# Patient Record
Sex: Male | Born: 1955 | Race: White | Hispanic: No | Marital: Married | Smoking: Never smoker
Health system: Southern US, Community
[De-identification: ages and names within clinical notes are randomized; demographics above are authoritative.]

---

## 1999-11-10 ENCOUNTER — Ambulatory Visit (HOSPITAL_COMMUNITY): Admission: RE | Admit: 1999-11-10 | Discharge: 1999-11-10 | Payer: Self-pay | Admitting: Family Medicine

## 1999-11-10 ENCOUNTER — Encounter: Payer: Self-pay | Admitting: Family Medicine

## 2000-01-01 ENCOUNTER — Ambulatory Visit (HOSPITAL_BASED_OUTPATIENT_CLINIC_OR_DEPARTMENT_OTHER): Admission: RE | Admit: 2000-01-01 | Discharge: 2000-01-01 | Payer: Self-pay | Admitting: General Surgery

## 2000-01-01 ENCOUNTER — Encounter (INDEPENDENT_AMBULATORY_CARE_PROVIDER_SITE_OTHER): Payer: Self-pay | Admitting: *Deleted

## 2001-07-07 ENCOUNTER — Ambulatory Visit (HOSPITAL_COMMUNITY): Admission: RE | Admit: 2001-07-07 | Discharge: 2001-07-07 | Payer: Self-pay | Admitting: Family Medicine

## 2001-07-07 ENCOUNTER — Encounter: Payer: Self-pay | Admitting: Family Medicine

## 2001-10-23 ENCOUNTER — Ambulatory Visit (HOSPITAL_BASED_OUTPATIENT_CLINIC_OR_DEPARTMENT_OTHER): Admission: RE | Admit: 2001-10-23 | Discharge: 2001-10-23 | Payer: Self-pay | Admitting: Family Medicine

## 2002-04-20 ENCOUNTER — Ambulatory Visit (HOSPITAL_COMMUNITY): Admission: RE | Admit: 2002-04-20 | Discharge: 2002-04-20 | Payer: Self-pay | Admitting: Family Medicine

## 2002-04-20 ENCOUNTER — Encounter: Payer: Self-pay | Admitting: Family Medicine

## 2002-04-24 ENCOUNTER — Ambulatory Visit (HOSPITAL_COMMUNITY): Admission: RE | Admit: 2002-04-24 | Discharge: 2002-04-24 | Payer: Self-pay | Admitting: Family Medicine

## 2002-04-24 ENCOUNTER — Encounter: Payer: Self-pay | Admitting: Family Medicine

## 2005-04-04 ENCOUNTER — Emergency Department (HOSPITAL_COMMUNITY): Admission: EM | Admit: 2005-04-04 | Discharge: 2005-04-04 | Payer: Self-pay | Admitting: *Deleted

## 2006-05-10 ENCOUNTER — Encounter: Admission: RE | Admit: 2006-05-10 | Discharge: 2006-08-08 | Payer: Self-pay | Admitting: *Deleted

## 2008-02-23 ENCOUNTER — Encounter: Admission: RE | Admit: 2008-02-23 | Discharge: 2008-02-23 | Payer: Self-pay | Admitting: Orthopedic Surgery

## 2009-11-08 ENCOUNTER — Encounter: Admission: RE | Admit: 2009-11-08 | Discharge: 2009-11-08 | Payer: Self-pay | Admitting: Family Medicine

## 2009-11-12 ENCOUNTER — Encounter: Admission: RE | Admit: 2009-11-12 | Discharge: 2009-11-12 | Payer: Self-pay | Admitting: Family Medicine

## 2010-06-13 NOTE — Op Note (Signed)
[place]  Patient:    Matthew Morgan, Matthew Morgan                      MRN: [id number] Proc. Date: 01/01/00 Adm. Date:  [id number] Attending:  Arlis Porta CC:         Meredith Staggers, M.D.   Operative Report  PREOPERATIVE DIAGNOSIS:  Umbilical hernia.  POSTOPERATIVE DIAGNOSIS:  Incarcerated umbilical hernia.  OPERATION PERFORMED:  Repair of incarcerated umbilical hernia with mesh.  SURGEON:  Adolph Pollack, M.D.  ANESTHESIA:  General plus 0.5% Marcaine with epinephrine for local anesthetic effect.  INDICATIONS FOR PROCEDURE:  The patient is a 55 year old male who has been having an increasingly symptomatic umbilical hernia.  He now presents for repair.  DESCRIPTION OF PROCEDURE:  He was placed supine on the operating table and a general anesthetic was administered. The periumbilical area was shaved and sterilely prepped and draped.  Local anesthetic was infiltrated in a periumbilical fashion and a curvilinear subumbilical incision was made incising the skin and subcutaneous tissues sharply.  The hernia sac was identified.  The subcutaneous tissues were dissected down to the level of the fascia.  The umbilicus was amputated and the hernia sac opened, the sac excised, and the omental contents reduced back into the abdominal cavity.  Next, the fatty tissue was separated from the anterior rectus fascia in the circumferential fashion. The hernia defect was then closed with interrupted 0 Surgilon sutures.  A piece of onlay polypropylene mesh was placed over the closure and anchored to the fascia with interrupted Surgilon sutures.  The umbilicus was then reattached to the mesh with interrupted 3-0 Vicryl suture.  The area was irrigated and the subcutaneous tissue was closed over the mesh with running 3-0 Vicryl suture.  The skin was closed with a 4-0 Monocryl subcuticular stitch followed by Steri-Strips and sterile dressings.  The  patient tolerated the procedure well without any apparent complications and was taken to the recovery room in satisfactory condition. DD:  01/01/00 TD:  01/01/00 Job: [id number]

## 2010-07-09 ENCOUNTER — Other Ambulatory Visit: Payer: Self-pay | Admitting: Chiropractor

## 2010-07-09 DIAGNOSIS — R200 Anesthesia of skin: Secondary | ICD-10-CM

## 2010-07-09 DIAGNOSIS — M545 Low back pain: Secondary | ICD-10-CM

## 2010-07-10 ENCOUNTER — Ambulatory Visit
Admission: RE | Admit: 2010-07-10 | Discharge: 2010-07-10 | Disposition: A | Discharge status: Home / Self Care | Payer: BC Managed Care – PPO | Source: Ambulatory Visit | Attending: Chiropractor | Admitting: Chiropractor

## 2010-07-10 DIAGNOSIS — M545 Low back pain: Secondary | ICD-10-CM

## 2010-07-10 DIAGNOSIS — R200 Anesthesia of skin: Secondary | ICD-10-CM

## 2010-07-15 ENCOUNTER — Inpatient Hospital Stay: Admission: RE | Admit: 2010-07-15 | Payer: BC Managed Care – PPO | Source: Ambulatory Visit

## 2010-11-12 ENCOUNTER — Other Ambulatory Visit: Payer: Self-pay | Admitting: Family Medicine

## 2010-11-12 DIAGNOSIS — M79672 Pain in left foot: Secondary | ICD-10-CM

## 2010-11-14 ENCOUNTER — Ambulatory Visit
Admission: RE | Admit: 2010-11-14 | Discharge: 2010-11-14 | Disposition: A | Discharge status: Home / Self Care | Payer: BC Managed Care – PPO | Source: Ambulatory Visit | Attending: Family Medicine | Admitting: Family Medicine

## 2010-11-14 DIAGNOSIS — M79672 Pain in left foot: Secondary | ICD-10-CM

## 2017-04-20 ENCOUNTER — Other Ambulatory Visit: Payer: Self-pay | Admitting: Family Medicine

## 2017-04-20 ENCOUNTER — Ambulatory Visit
Admission: RE | Admit: 2017-04-20 | Discharge: 2017-04-20 | Disposition: A | Discharge status: Home / Self Care | Payer: BC Managed Care – PPO | Source: Ambulatory Visit | Attending: Family Medicine | Admitting: Family Medicine

## 2017-04-20 DIAGNOSIS — R05 Cough: Secondary | ICD-10-CM

## 2017-04-20 DIAGNOSIS — R059 Cough, unspecified: Secondary | ICD-10-CM

## 2018-03-24 ENCOUNTER — Other Ambulatory Visit: Payer: Self-pay | Admitting: Family Medicine

## 2018-03-24 DIAGNOSIS — R109 Unspecified abdominal pain: Secondary | ICD-10-CM

## 2018-04-01 ENCOUNTER — Other Ambulatory Visit: Payer: BC Managed Care – PPO

## 2018-12-06 NOTE — Progress Notes (Deleted)
    Matthew Dopp, MD Reason for referral-Dyspnea  HPI: 63 year old male for evaluation of dyspnea at request of Shirline Frees, MD.  Also with history of diabetes mellitus, hypertension and hyperlipidemia.  No current outpatient medications on file.   No current facility-administered medications for this visit.     Not on File  No past medical history on file.  *** The histories are not reviewed yet. Please review them in the "History" navigator section and refresh this Terry.  Social History   Socioeconomic History  . Marital status: Married    Spouse name: Not on file  . Number of children: Not on file  . Years of education: Not on file  . Highest education level: Not on file  Occupational History  . Not on file  Social Needs  . Financial resource strain: Not on file  . Food insecurity    Worry: Not on file    Inability: Not on file  . Transportation needs    Medical: Not on file    Non-medical: Not on file  Tobacco Use  . Smoking status: Not on file  Substance and Sexual Activity  . Alcohol use: Not on file  . Drug use: Not on file  . Sexual activity: Not on file  Lifestyle  . Physical activity    Days per week: Not on file    Minutes per session: Not on file  . Stress: Not on file  Relationships  . Social Herbalist on phone: Not on file    Gets together: Not on file    Attends religious service: Not on file    Active member of club or organization: Not on file    Attends meetings of clubs or organizations: Not on file    Relationship status: Not on file  . Intimate partner violence    Fear of current or ex partner: Not on file    Emotionally abused: Not on file    Physically abused: Not on file    Forced sexual activity: Not on file  Other Topics Concern  . Not on file  Social History Narrative  . Not on file    No family history on file.  ROS: no fevers or chills, productive cough, hemoptysis, dysphasia, odynophagia,  melena, hematochezia, dysuria, hematuria, rash, seizure activity, orthopnea, PND, pedal edema, claudication. Remaining systems are negative.  Physical Exam:   There were no vitals taken for this visit.  General:  Well developed/well nourished in NAD Skin warm/dry Patient not depressed No peripheral clubbing Back-normal HEENT-normal/normal eyelids Neck supple/normal carotid upstroke bilaterally; no bruits; no JVD; no thyromegaly chest - CTA/ normal expansion CV - RRR/normal S1 and S2; no murmurs, rubs or gallops;  PMI nondisplaced Abdomen -NT/ND, no HSM, no mass, + bowel sounds, no bruit 2+ femoral pulses, no bruits Ext-no edema, chords, 2+ DP Neuro-grossly nonfocal  ECG - personally reviewed  A/P  1 dyspnea-  2 hypertension-blood pressure controlled.  Continue present medications and follow.  3 hyperlipidemia-  4 diabetes mellitus-  Kirk Ruths, MD

## 2018-12-07 ENCOUNTER — Ambulatory Visit: Payer: BC Managed Care – PPO | Admitting: Internal Medicine

## 2018-12-07 ENCOUNTER — Encounter: Payer: Self-pay | Admitting: Internal Medicine

## 2018-12-07 ENCOUNTER — Ambulatory Visit (INDEPENDENT_AMBULATORY_CARE_PROVIDER_SITE_OTHER): Payer: BC Managed Care – PPO

## 2018-12-07 ENCOUNTER — Other Ambulatory Visit: Payer: Self-pay

## 2018-12-07 DIAGNOSIS — R0600 Dyspnea, unspecified: Secondary | ICD-10-CM | POA: Diagnosis not present

## 2018-12-07 DIAGNOSIS — R0609 Other forms of dyspnea: Secondary | ICD-10-CM

## 2018-12-07 MED ORDER — PANTOPRAZOLE SODIUM 40 MG PO TBEC: DELAYED_RELEASE_TABLET | ORAL | 2 refills | Status: AC

## 2018-12-07 NOTE — Patient Instructions (Addendum)
Protonix 40 mg  Take 30- 60 min before your first and last meals of the day   GERD (REFLUX)  is an extremely common cause of respiratory symptoms just like yours , many times with no obvious heartburn at all.    It can be treated with medication, but also with lifestyle changes including elevation of the head of your bed (ideally with 6 -8inch blocks under the headboard of your bed),  Smoking cessation, avoidance of late meals, excessive alcohol, and avoid fatty foods, chocolate, peppermint, colas, red wine, and acidic juices such as orange juice.  NO MINT OR MENTHOL PRODUCTS SO NO COUGH DROPS  USE SUGARLESS CANDY INSTEAD (Jolley ranchers or Stover's or Life Savers) or even ice chips will also do - the key is to swallow to prevent all throat clearing. NO OIL BASED VITAMINS - use powdered substitutes.  Avoid fish oil when coughing.  Please remember to go to the  x-ray department  for your tests - we will call you with the results when they are available     Albuterol is a "rescue medication"  for relief of breathlessness  that does not improve by walking a slower pace or resting  for a few minutes (it doesn't really start working for 5 minutes anyway so ok to wait to see if resting helps)  However, if you are convinced, as many are, that albuterol  helps recover from activity faster then it's easy to tell if this is the case by re-challenging : stop, take the inhaler, then 5 minutes later try the exact same activity (intensity of workload) that just caused the symptoms and see if they are better by using the albuterol prior to exertion.  If  there is an activity that reproducibly causes the breathing problem every time you attempt it, try the albuterol  (either inhaler or nebulizer)  15 min before the activity on alternate days to see if there really is a difference and let me know what you find.    Please schedule a follow up office visit in 4 weeks, sooner if needed  with all medications  /inhalers/ solutions in hand so we can verify exactly what you are taking. This includes all medications from all doctors and over the counters

## 2018-12-07 NOTE — Progress Notes (Signed)
Matthew Morgan, male    DOB: November 27, 1955,     MRN: [id number]   Brief patient profile:  52 yowm never smoker  cleft palate repaired around age 63-5 in [place] then lots of "bronchitis" then eventually sinus surgery at [facility] x 2 age 44's and tendency to "bronchitis" improved and no ent f/u since around 2000  but   active yardwork and steps ok until noted doe x church steps  x 2018 progressively worse since summer 2020 > Dr Tiburcio Pea eval > [facility] eval in [place] "nl" > referred to pulmonary clinic 12/07/2018 by Dr   Leonides Sake.  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 Tiburcio Pea. Pt c/o DOE since Aug 2020. He gets winded working in his yard and carrying his grand daughter.   Dyspnea:   Yardwork, lifting grandchild around the yard, no steps at home but has to stop after two flight  Post R shoulder pain worse later in day/ aleve helps / settles down overnight Cough: none Sleep: after ambien sleeps fine  SABA use: has one hasn't used yet   No obvious day to day or daytime variability or assoc excess/ purulent sputum or mucus plugs or hemoptysis or cp or subjective wheeze or overt presently active sinus symptoms.   Sleeping p ambient  without nocturnal  or early am exacerbation  of respiratory  c/o's or need for noct saba. Also denies any obvious fluctuation of symptoms with weather or environmental changes or other aggravating or alleviating factors except as outlined above   No unusual exposure hx or h/o childhood pna/ asthma or knowledge of premature birth.  Current Allergies, Complete Past Medical History, Past Surgical History, Family History, and Social History were reviewed in [employer] record.  ROS  The following are not active complaints unless  bolded Hoarseness, sore throat, dysphagia, dental problems, itching, sneezing,  nasal congestion or discharge of excess mucus or purulent secretions, ear ache,   fever, chills, sweats, unintended wt loss or wt gain, classically pleuritic or exertional cp,  orthopnea pnd or arm/hand swelling  or leg swelling, presyncope, palpitations, abdominal pain, anorexia, nausea, vomiting, diarrhea  or change in bowel habits or change in bladder habits, change in stools or change in urine, dysuria, hematuria,  rash, arthralgias, visual complaints, headache, numbness, weakness or ataxia or problems with walking or coordination,  change in mood or  memory.           No past medical history on file.  Outpatient Medications Prior to Visit  Medication Sig Dispense Refill   albuterol (VENTOLIN HFA) 108 (90 Base) MCG/ACT inhaler Inhale 2 puffs into the lungs every 4 (four) hours as needed.     aspirin EC 81 MG tablet Take 1 tablet by mouth daily.     co-enzyme Q-10 30 MG capsule Take 1 capsule by mouth daily.     famotidine (PEPCID) 20 MG tablet Take 1 tablet by mouth 2 (two) times daily.     fluticasone (FLONASE) 50 MCG/ACT nasal spray Place 2 sprays into both nostrils daily.     glimepiride (AMARYL) 4 MG tablet Take 1 tablet by mouth daily.     losartan (COZAAR) 100 MG tablet Take 1 tablet by mouth daily.  metFORMIN (GLUCOPHAGE-XR) 500 MG 24 hr tablet 2 am and 1 pm     pravastatin (PRAVACHOL) 40 MG tablet Take 1 tablet by mouth daily.     Specialty Vitamins Products (PROSTATE) TABS Take 1 tablet by mouth daily.     tiZANidine (ZANAFLEX) 4 MG capsule Take 1 capsule by mouth 3 (three) times daily as needed.     zolpidem (AMBIEN) 10 MG tablet Take 10 mg by mouth at bedtime as needed.           Objective:     BP 130/84 (BP Location: Left Arm, Cuff Size: Normal)    Pulse 72    Temp 97.7 F (36.5 C) (Temporal)    Ht 5\' 7"  (1.702 m)    Wt 189 lb (85.7 kg)    SpO2 96% Comment: on RA   BMI 29.60  kg/m   SpO2: 96 %(on RA)   Pleasant amb wm nad    HEENT : pt wearing mask not removed for exam due to covid -19 concerns.    NECK :  without JVD/Nodes/TM/ nl carotid upstrokes bilaterally   LUNGS: no acc muscle use,  Nl contour chest with slt decreased BS bases esp on R  without cough on insp or exp maneuvers   CV:  RRR  no s3 or murmur or increase in P2, and no edema   ABD:  Slightly obese/ soft and nontender with nl inspiratory excursion in the supine position. No bruits or organomegaly appreciated, bowel sounds nl  MS:  Nl gait/ ext warm without deformities, calf tenderness, cyanosis or clubbing No obvious joint restrictions   SKIN: warm and dry without lesions    NEURO:  alert, approp, nl sensorium with  no motor or cerebellar deficits apparent.        CXR PA and Lateral:   12/07/2018 :    I personally reviewed images and agree with radiology impression as follows:   No acute cardiopulmonary disease. 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       Assessment   DOE (dyspnea on exertion) Onset 2018 indolent/ slowly progressive pattern proportionate to ex  - nl heart cath at [facility] 11/08/18 - No significant coronary artery disease - Left ventricular filling pressures (LVEDP = 15 mm Hg). - Left ventricular contractile function (estimated LVEF = 55).  12/07/2018   Walked RA  2 laps @  approx 28ft each @ mod to fast pace  stopped due to  End of study, no sob and sats 94%   - start reflux rx / diet 12/07/2018   Symptoms are markedly disproportionate to objective findings and not clear to what extent this is actually a pulmonary  problem but pt does appear to have difficult to sort out respiratory symptoms of unknown origin for which  DDX  = almost all start with A and  include Adherence, Ace Inhibitors, Acid Reflux, Active Sinus Disease, Alpha 1 Antitripsin deficiency, Anxiety  masquerading as Airways dz,  ABPA,  Allergy(esp in young), Aspiration (esp in elderly), Adverse effects of meds,  Active smoking or Vaping, A bunch of PE's/clot burden (a few small clots can't cause this syndrome unless there is already severe underlying pulm or vascular dz with poor reserve),  Anemia or thyroid disorder, plus two Bs  = Bronchiectasis and Beta blocker use..and one C= CHF     Adherence is always the initial "prime suspect" and is a multilayered concern that requires a "trust but  verify" approach in every patient - starting with knowing how to use medications, especially inhalers, correctly, keeping up with refills and understanding the fundamental difference between maintenance and prns vs those medications only taken for a very short course and then stopped and not refilled.   ? 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   ? Allergy/ asthma - I spent extra time with pt today reviewing appropriate use of albuterol for prn use on exertion with the following points: 1) saba is for relief of sob that does not improve by walking a slower pace or resting but rather if the pt does not improve after trying this first. 2) If the pt is convinced, as many are, that saba helps recover from activity faster then it's easy to tell if this is the case by re-challenging : ie stop, take the inhaler, then p 5 minutes try the exact same activity (intensity of workload) that just caused the symptoms and see if they are substantially diminished or not after saba 3) if there is an activity that reproducibly causes the symptoms, try the saba 15 min before the activity on alternate days   If in fact the saba really does help, then fine to continue to use it prn but advised may need to  look closer at the maintenance regimen being used to achieve better control of airways disease with exertion.   ? Adverse drug effects > none of the usual suspects listed   ? Active sinus dz with sinobronchial reflexes inducing asthma like symptoms seems unlikely s more cough esp noct though low threshold to doCT sinus next   ? Anxiety/depression / deconditioning > usually at the bottom of this list of usual suspects but   may interfere with adherence and also interpretation of response or lack thereof to symptom management which can be quite subjective.  ? chf > ruled out by cards eval 10/2018    Total time devoted to counseling  > 50 % of initial 60 min office visit:  reviewed case with pt/ directly observed portions of ambulatory 02 saturation study/  discussion of options/alternatives/ personally creating written customized instructions  in presence of pt  then going over those specific  Instructions directly with the pt including how to use all of the meds but in particular covering each new medication in detail and the difference between the maintenance= "automatic" meds and the prns using an action plan format for the latter (If this problem/symptom => do that organization reading Left to right).  Please see AVS from this visit for a full list of these instructions which I personally wrote for this pt and  are unique to this visit.         Sandrea HughsMichael Jeannett Dekoning, MD 12/07/2018

## 2018-12-07 NOTE — Assessment & Plan Note (Addendum)
Onset 2018 indolent/ slowly progressive pattern proportionate to ex  - nl heart cath at [facility] 11/08/18 - No significant coronary artery disease - Left ventricular filling pressures (LVEDP = 15 mm Hg). - Left ventricular contractile function (estimated LVEF = 55).  12/07/2018   Walked RA  2 laps @  approx 247ft each @ mod to fast pace  stopped due to  End of study, no sob and sats 94%   - start reflux rx / diet 12/07/2018   Symptoms are markedly disproportionate to objective findings and not clear to what extent this is actually a pulmonary  problem but pt does appear to have difficult to sort out respiratory symptoms of unknown origin for which  DDX  = almost all start with A and  include Adherence, Ace Inhibitors, Acid Reflux, Active Sinus Disease, Alpha 1 Antitripsin deficiency, Anxiety masquerading as Airways dz,  ABPA,  Allergy(esp in young), Aspiration (esp in elderly), Adverse effects of meds,  Active smoking or Vaping, A bunch of PE's/clot burden (a few small clots can't cause this syndrome unless there is already severe underlying pulm or vascular dz with poor reserve),  Anemia or thyroid disorder, plus two Bs  = Bronchiectasis and Beta blocker use..and one C= CHF     Adherence is always the initial "prime suspect" and is a multilayered concern that requires a "trust but verify" approach in every patient - starting with knowing how to use medications, especially inhalers, correctly, keeping up with refills and understanding the fundamental difference between maintenance and prns vs those medications only taken for a very short course and then stopped and not refilled.   ? 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   ? Allergy/ asthma - I spent extra time with pt  today reviewing appropriate use of albuterol for prn use on exertion with the following points: 1) saba is for relief of sob that does not improve by walking a slower pace or resting but rather if the pt does not improve after trying this first. 2) If the pt is convinced, as many are, that saba helps recover from activity faster then it's easy to tell if this is the case by re-challenging : ie stop, take the inhaler, then p 5 minutes try the exact same activity (intensity of workload) that just caused the symptoms and see if they are substantially diminished or not after saba 3) if there is an activity that reproducibly causes the symptoms, try the saba 15 min before the activity on alternate days   If in fact the saba really does help, then fine to continue to use it prn but advised may need to look closer at the maintenance regimen being used to achieve better control of airways disease with exertion.   ? Adverse drug effects > none of the usual suspects listed   ? Active sinus dz with sinobronchial reflexes inducing asthma like symptoms seems unlikely s more cough esp noct though low threshold to doCT sinus next   ? Anxiety/depression / deconditioning > usually at the bottom of this list of usual suspects but   may interfere with adherence and also interpretation of response or lack thereof to symptom management which can be quite subjective.  ? chf > ruled out by  cards eval 10/2018    Total time devoted to counseling  > 50 % of initial 60 min office visit:  reviewed case with pt/ directly observed portions of ambulatory 02 saturation study/  discussion of options/alternatives/ personally creating written customized instructions  in presence of pt  then going over those specific  Instructions directly with the pt including how to use all of the meds but in particular covering each new medication in detail and the difference between the maintenance= "automatic" meds and the prns using an action  plan format for the latter (If this problem/symptom => do that organization reading Left to right).  Please see AVS from this visit for a full list of these instructions which I personally wrote for this pt and  are unique to this visit.

## 2018-12-07 NOTE — Progress Notes (Signed)
Spoke with pt and notified of results per Dr. Wert. Pt verbalized understanding and denied any questions. 

## 2018-12-14 ENCOUNTER — Ambulatory Visit: Payer: BC Managed Care – PPO | Admitting: Cardiology

## 2019-01-04 ENCOUNTER — Ambulatory Visit: Payer: BC Managed Care – PPO | Admitting: Internal Medicine

## 2021-05-20 IMAGING — DX DG CHEST 2V
2 series · 2 of 2 positions shown · non-contrast
Comparison: 04/20/2017 chest radiograph.

CLINICAL DATA: Dyspnea on exertion

EXAM:
CHEST - 2 VIEW

[chest pa]
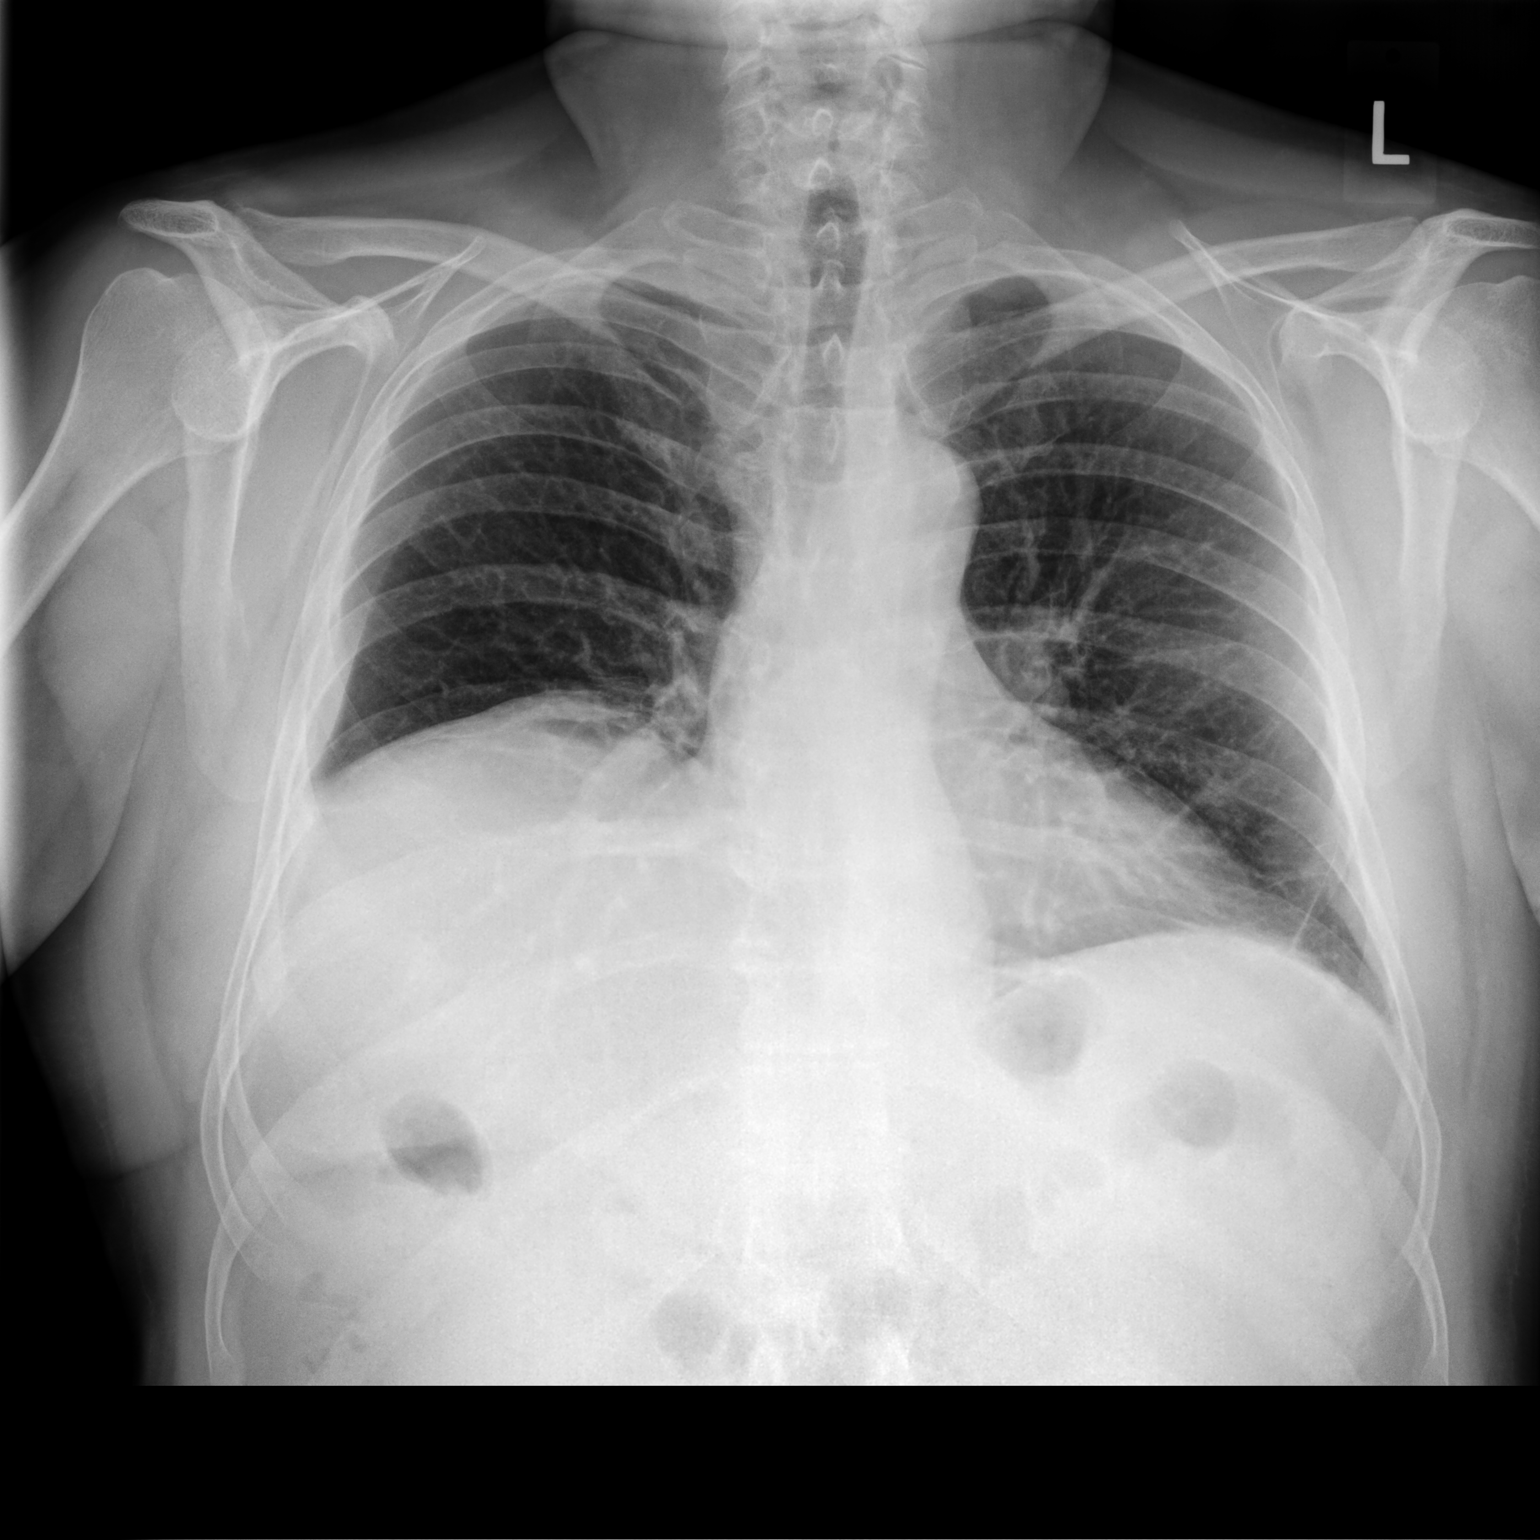

[chest lat]
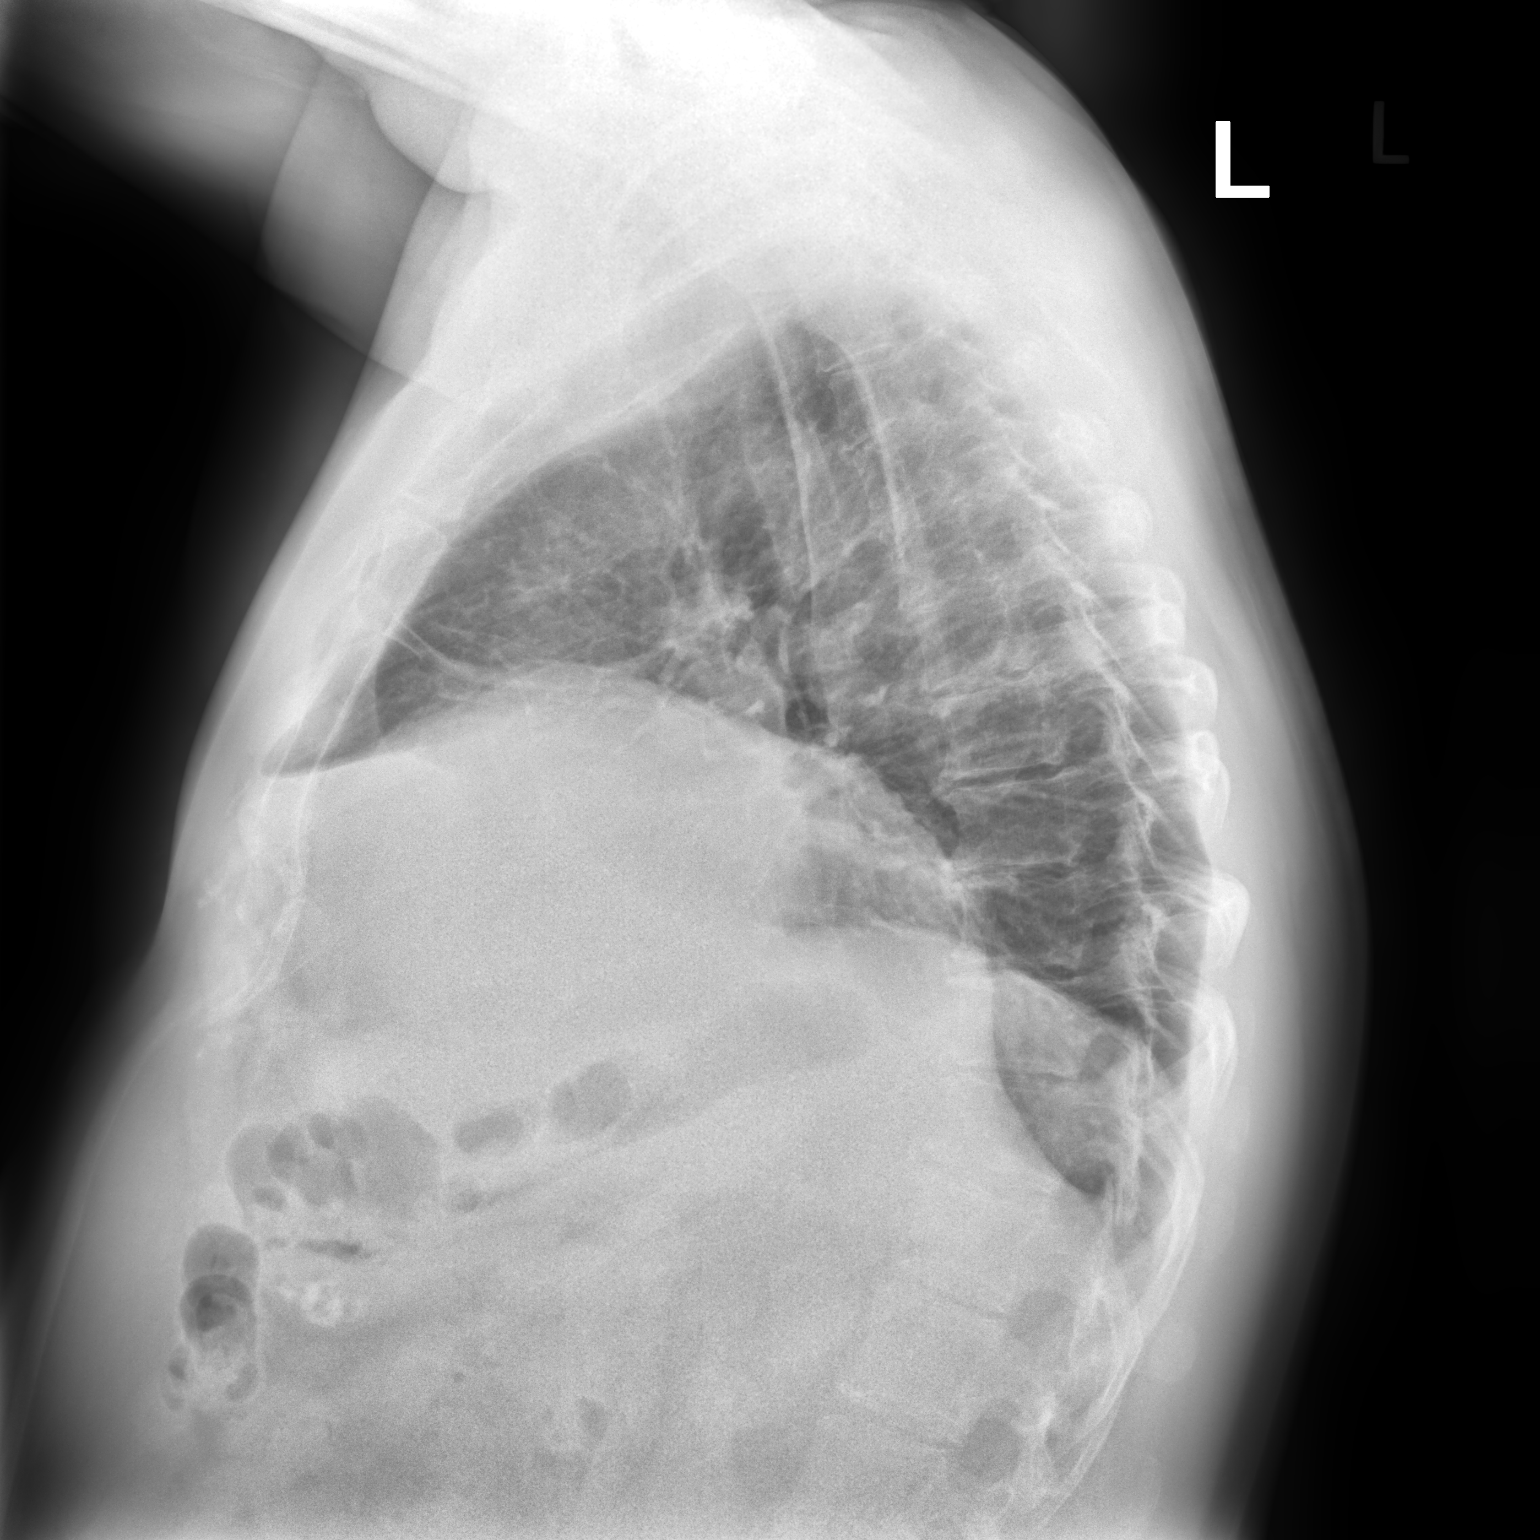

[2 of 2 positions shown; findings below may reference images not displayed]

FINDINGS: Low lung volumes. Stable cardiomediastinal silhouette with normal
heart size. No pneumothorax. No pleural effusion. Stable moderate
eventration of the anterior right hemidiaphragm. No pulmonary edema.
Stable right basilar atelectasis. No acute consolidative airspace
disease.
IMPRESSION: No acute cardiopulmonary disease. Low lung volumes with chronic
moderate right anterior hemidiaphragmatic eventration and associated
right basilar atelectasis.

## 2021-09-08 ENCOUNTER — Other Ambulatory Visit: Payer: Self-pay | Admitting: Family Medicine

## 2021-09-08 ENCOUNTER — Ambulatory Visit
Admission: RE | Admit: 2021-09-08 | Discharge: 2021-09-08 | Disposition: A | Discharge status: Home / Self Care | Payer: BC Managed Care – PPO | Source: Ambulatory Visit | Attending: Family Medicine | Admitting: Family Medicine

## 2021-09-08 DIAGNOSIS — M79641 Pain in right hand: Secondary | ICD-10-CM

## 2022-06-15 DIAGNOSIS — E119 Type 2 diabetes mellitus without complications: Secondary | ICD-10-CM | POA: Diagnosis not present

## 2022-06-15 DIAGNOSIS — H40053 Ocular hypertension, bilateral: Secondary | ICD-10-CM | POA: Diagnosis not present

## 2022-06-15 DIAGNOSIS — Z961 Presence of intraocular lens: Secondary | ICD-10-CM | POA: Diagnosis not present

## 2022-06-15 DIAGNOSIS — H40013 Open angle with borderline findings, low risk, bilateral: Secondary | ICD-10-CM | POA: Diagnosis not present

## 2022-09-07 DIAGNOSIS — J329 Chronic sinusitis, unspecified: Secondary | ICD-10-CM | POA: Diagnosis not present

## 2022-09-07 DIAGNOSIS — R059 Cough, unspecified: Secondary | ICD-10-CM | POA: Diagnosis not present

## 2022-10-05 DIAGNOSIS — R749 Abnormal serum enzyme level, unspecified: Secondary | ICD-10-CM | POA: Diagnosis not present

## 2022-10-05 DIAGNOSIS — Z125 Encounter for screening for malignant neoplasm of prostate: Secondary | ICD-10-CM | POA: Diagnosis not present

## 2022-10-05 DIAGNOSIS — G4700 Insomnia, unspecified: Secondary | ICD-10-CM | POA: Diagnosis not present

## 2022-10-05 DIAGNOSIS — E1165 Type 2 diabetes mellitus with hyperglycemia: Secondary | ICD-10-CM | POA: Diagnosis not present

## 2022-10-05 DIAGNOSIS — E291 Testicular hypofunction: Secondary | ICD-10-CM | POA: Diagnosis not present

## 2022-10-05 DIAGNOSIS — Z8719 Personal history of other diseases of the digestive system: Secondary | ICD-10-CM | POA: Diagnosis not present

## 2022-10-05 DIAGNOSIS — E782 Mixed hyperlipidemia: Secondary | ICD-10-CM | POA: Diagnosis not present

## 2022-10-05 DIAGNOSIS — I10 Essential (primary) hypertension: Secondary | ICD-10-CM | POA: Diagnosis not present

## 2022-10-05 DIAGNOSIS — K219 Gastro-esophageal reflux disease without esophagitis: Secondary | ICD-10-CM | POA: Diagnosis not present

## 2022-10-05 DIAGNOSIS — M19041 Primary osteoarthritis, right hand: Secondary | ICD-10-CM | POA: Diagnosis not present

## 2022-10-05 DIAGNOSIS — Z23 Encounter for immunization: Secondary | ICD-10-CM | POA: Diagnosis not present

## 2022-10-20 DIAGNOSIS — H903 Sensorineural hearing loss, bilateral: Secondary | ICD-10-CM | POA: Diagnosis not present

## 2022-11-12 DIAGNOSIS — H903 Sensorineural hearing loss, bilateral: Secondary | ICD-10-CM | POA: Diagnosis not present

## 2023-01-06 DIAGNOSIS — L820 Inflamed seborrheic keratosis: Secondary | ICD-10-CM | POA: Diagnosis not present

## 2023-01-06 DIAGNOSIS — C44311 Basal cell carcinoma of skin of nose: Secondary | ICD-10-CM | POA: Diagnosis not present

## 2023-01-06 DIAGNOSIS — D225 Melanocytic nevi of trunk: Secondary | ICD-10-CM | POA: Diagnosis not present

## 2023-01-06 DIAGNOSIS — L821 Other seborrheic keratosis: Secondary | ICD-10-CM | POA: Diagnosis not present

## 2023-01-06 DIAGNOSIS — L578 Other skin changes due to chronic exposure to nonionizing radiation: Secondary | ICD-10-CM | POA: Diagnosis not present

## 2023-01-13 DIAGNOSIS — H40053 Ocular hypertension, bilateral: Secondary | ICD-10-CM | POA: Diagnosis not present

## 2023-01-13 DIAGNOSIS — H524 Presbyopia: Secondary | ICD-10-CM | POA: Diagnosis not present

## 2023-01-14 DIAGNOSIS — H524 Presbyopia: Secondary | ICD-10-CM | POA: Diagnosis not present

## 2023-03-07 DIAGNOSIS — Z008 Encounter for other general examination: Secondary | ICD-10-CM | POA: Diagnosis not present

## 2023-03-07 DIAGNOSIS — J209 Acute bronchitis, unspecified: Secondary | ICD-10-CM | POA: Diagnosis not present

## 2023-05-03 DIAGNOSIS — I10 Essential (primary) hypertension: Secondary | ICD-10-CM | POA: Diagnosis not present

## 2023-05-03 DIAGNOSIS — E782 Mixed hyperlipidemia: Secondary | ICD-10-CM | POA: Diagnosis not present

## 2023-05-03 DIAGNOSIS — E119 Type 2 diabetes mellitus without complications: Secondary | ICD-10-CM | POA: Diagnosis not present

## 2023-05-03 DIAGNOSIS — N529 Male erectile dysfunction, unspecified: Secondary | ICD-10-CM | POA: Diagnosis not present

## 2023-05-03 DIAGNOSIS — Z0000 Encounter for general adult medical examination without abnormal findings: Secondary | ICD-10-CM | POA: Diagnosis not present

## 2023-05-03 DIAGNOSIS — J309 Allergic rhinitis, unspecified: Secondary | ICD-10-CM | POA: Diagnosis not present

## 2023-05-03 DIAGNOSIS — N401 Enlarged prostate with lower urinary tract symptoms: Secondary | ICD-10-CM | POA: Diagnosis not present

## 2023-05-03 DIAGNOSIS — K219 Gastro-esophageal reflux disease without esophagitis: Secondary | ICD-10-CM | POA: Diagnosis not present

## 2023-05-03 DIAGNOSIS — M19041 Primary osteoarthritis, right hand: Secondary | ICD-10-CM | POA: Diagnosis not present

## 2023-05-11 DIAGNOSIS — Z6829 Body mass index (BMI) 29.0-29.9, adult: Secondary | ICD-10-CM | POA: Diagnosis not present

## 2023-05-11 DIAGNOSIS — E663 Overweight: Secondary | ICD-10-CM | POA: Diagnosis not present

## 2023-05-11 DIAGNOSIS — E1169 Type 2 diabetes mellitus with other specified complication: Secondary | ICD-10-CM | POA: Diagnosis not present

## 2023-05-11 DIAGNOSIS — I10 Essential (primary) hypertension: Secondary | ICD-10-CM | POA: Diagnosis not present

## 2023-05-11 DIAGNOSIS — E785 Hyperlipidemia, unspecified: Secondary | ICD-10-CM | POA: Diagnosis not present

## 2023-05-11 DIAGNOSIS — Z008 Encounter for other general examination: Secondary | ICD-10-CM | POA: Diagnosis not present

## 2023-05-24 DIAGNOSIS — M51362 Other intervertebral disc degeneration, lumbar region with discogenic back pain and lower extremity pain: Secondary | ICD-10-CM | POA: Diagnosis not present

## 2023-05-24 DIAGNOSIS — M5137 Other intervertebral disc degeneration, lumbosacral region with discogenic back pain only: Secondary | ICD-10-CM | POA: Diagnosis not present

## 2023-05-24 DIAGNOSIS — M9903 Segmental and somatic dysfunction of lumbar region: Secondary | ICD-10-CM | POA: Diagnosis not present

## 2023-05-24 DIAGNOSIS — M5135 Other intervertebral disc degeneration, thoracolumbar region: Secondary | ICD-10-CM | POA: Diagnosis not present

## 2023-05-24 DIAGNOSIS — M9902 Segmental and somatic dysfunction of thoracic region: Secondary | ICD-10-CM | POA: Diagnosis not present

## 2023-05-24 DIAGNOSIS — M9904 Segmental and somatic dysfunction of sacral region: Secondary | ICD-10-CM | POA: Diagnosis not present

## 2023-05-27 DIAGNOSIS — M9902 Segmental and somatic dysfunction of thoracic region: Secondary | ICD-10-CM | POA: Diagnosis not present

## 2023-05-27 DIAGNOSIS — M5135 Other intervertebral disc degeneration, thoracolumbar region: Secondary | ICD-10-CM | POA: Diagnosis not present

## 2023-05-27 DIAGNOSIS — M5137 Other intervertebral disc degeneration, lumbosacral region with discogenic back pain only: Secondary | ICD-10-CM | POA: Diagnosis not present

## 2023-05-27 DIAGNOSIS — M9903 Segmental and somatic dysfunction of lumbar region: Secondary | ICD-10-CM | POA: Diagnosis not present

## 2023-05-27 DIAGNOSIS — M51362 Other intervertebral disc degeneration, lumbar region with discogenic back pain and lower extremity pain: Secondary | ICD-10-CM | POA: Diagnosis not present

## 2023-05-27 DIAGNOSIS — M9904 Segmental and somatic dysfunction of sacral region: Secondary | ICD-10-CM | POA: Diagnosis not present

## 2023-06-03 DIAGNOSIS — M9904 Segmental and somatic dysfunction of sacral region: Secondary | ICD-10-CM | POA: Diagnosis not present

## 2023-06-03 DIAGNOSIS — M9903 Segmental and somatic dysfunction of lumbar region: Secondary | ICD-10-CM | POA: Diagnosis not present

## 2023-06-03 DIAGNOSIS — M9902 Segmental and somatic dysfunction of thoracic region: Secondary | ICD-10-CM | POA: Diagnosis not present

## 2023-06-03 DIAGNOSIS — M5135 Other intervertebral disc degeneration, thoracolumbar region: Secondary | ICD-10-CM | POA: Diagnosis not present

## 2023-06-03 DIAGNOSIS — M5137 Other intervertebral disc degeneration, lumbosacral region with discogenic back pain only: Secondary | ICD-10-CM | POA: Diagnosis not present

## 2023-06-03 DIAGNOSIS — M51362 Other intervertebral disc degeneration, lumbar region with discogenic back pain and lower extremity pain: Secondary | ICD-10-CM | POA: Diagnosis not present

## 2023-06-15 DIAGNOSIS — M9904 Segmental and somatic dysfunction of sacral region: Secondary | ICD-10-CM | POA: Diagnosis not present

## 2023-06-15 DIAGNOSIS — M9902 Segmental and somatic dysfunction of thoracic region: Secondary | ICD-10-CM | POA: Diagnosis not present

## 2023-06-15 DIAGNOSIS — M5135 Other intervertebral disc degeneration, thoracolumbar region: Secondary | ICD-10-CM | POA: Diagnosis not present

## 2023-06-15 DIAGNOSIS — M51362 Other intervertebral disc degeneration, lumbar region with discogenic back pain and lower extremity pain: Secondary | ICD-10-CM | POA: Diagnosis not present

## 2023-06-15 DIAGNOSIS — M5137 Other intervertebral disc degeneration, lumbosacral region with discogenic back pain only: Secondary | ICD-10-CM | POA: Diagnosis not present

## 2023-06-15 DIAGNOSIS — M9903 Segmental and somatic dysfunction of lumbar region: Secondary | ICD-10-CM | POA: Diagnosis not present

## 2023-07-14 DIAGNOSIS — L578 Other skin changes due to chronic exposure to nonionizing radiation: Secondary | ICD-10-CM | POA: Diagnosis not present

## 2023-07-14 DIAGNOSIS — L821 Other seborrheic keratosis: Secondary | ICD-10-CM | POA: Diagnosis not present

## 2023-07-14 DIAGNOSIS — C44311 Basal cell carcinoma of skin of nose: Secondary | ICD-10-CM | POA: Diagnosis not present

## 2023-11-01 DIAGNOSIS — I10 Essential (primary) hypertension: Secondary | ICD-10-CM | POA: Diagnosis not present

## 2023-11-01 DIAGNOSIS — Z125 Encounter for screening for malignant neoplasm of prostate: Secondary | ICD-10-CM | POA: Diagnosis not present

## 2023-11-01 DIAGNOSIS — N529 Male erectile dysfunction, unspecified: Secondary | ICD-10-CM | POA: Diagnosis not present

## 2023-11-01 DIAGNOSIS — E782 Mixed hyperlipidemia: Secondary | ICD-10-CM | POA: Diagnosis not present

## 2023-11-01 DIAGNOSIS — E119 Type 2 diabetes mellitus without complications: Secondary | ICD-10-CM | POA: Diagnosis not present

## 2023-11-01 DIAGNOSIS — K219 Gastro-esophageal reflux disease without esophagitis: Secondary | ICD-10-CM | POA: Diagnosis not present

## 2023-11-01 DIAGNOSIS — J309 Allergic rhinitis, unspecified: Secondary | ICD-10-CM | POA: Diagnosis not present

## 2023-11-01 DIAGNOSIS — M19041 Primary osteoarthritis, right hand: Secondary | ICD-10-CM | POA: Diagnosis not present

## 2023-11-01 DIAGNOSIS — Z8719 Personal history of other diseases of the digestive system: Secondary | ICD-10-CM | POA: Diagnosis not present

## 2023-11-01 DIAGNOSIS — N401 Enlarged prostate with lower urinary tract symptoms: Secondary | ICD-10-CM | POA: Diagnosis not present

## 2023-11-15 DIAGNOSIS — Z6829 Body mass index (BMI) 29.0-29.9, adult: Secondary | ICD-10-CM | POA: Diagnosis not present

## 2023-11-15 DIAGNOSIS — Z008 Encounter for other general examination: Secondary | ICD-10-CM | POA: Diagnosis not present

## 2023-11-15 DIAGNOSIS — H40053 Ocular hypertension, bilateral: Secondary | ICD-10-CM | POA: Diagnosis not present

## 2023-11-15 DIAGNOSIS — E663 Overweight: Secondary | ICD-10-CM | POA: Diagnosis not present
